# Patient Record
Sex: Male | Born: 1975 | Race: White | Hispanic: No | Marital: Married | Smoking: Current every day smoker
Health system: Southern US, Community
[De-identification: ages and names within clinical notes are randomized; demographics above are authoritative.]

## PROBLEM LIST (undated history)

## (undated) DIAGNOSIS — I10 Essential (primary) hypertension: Secondary | ICD-10-CM

## (undated) DIAGNOSIS — F1010 Alcohol abuse, uncomplicated: Secondary | ICD-10-CM

## (undated) DIAGNOSIS — S21339A Puncture wound without foreign body of unspecified front wall of thorax with penetration into thoracic cavity, initial encounter: Secondary | ICD-10-CM

## (undated) DIAGNOSIS — E785 Hyperlipidemia, unspecified: Secondary | ICD-10-CM

## (undated) HISTORY — PX: OTHER SURGICAL HISTORY: SHX169

## (undated) HISTORY — PX: FRONTAL SINUSOTOMY: SUR1296

## (undated) HISTORY — PX: ATRIAL SEPTECTOMY: SHX1199

---

## 2004-12-10 ENCOUNTER — Ambulatory Visit: Payer: Self-pay | Admitting: Family Medicine

## 2004-12-19 ENCOUNTER — Ambulatory Visit: Payer: Self-pay | Admitting: Family Medicine

## 2005-04-03 ENCOUNTER — Emergency Department: Payer: Self-pay | Admitting: Emergency Medicine

## 2005-10-30 ENCOUNTER — Emergency Department: Payer: Self-pay | Admitting: Emergency Medicine

## 2006-04-15 ENCOUNTER — Ambulatory Visit: Payer: Self-pay | Admitting: Surgery

## 2008-08-06 ENCOUNTER — Other Ambulatory Visit: Payer: Self-pay

## 2008-08-06 ENCOUNTER — Emergency Department: Payer: Self-pay | Admitting: Emergency Medicine

## 2008-10-03 ENCOUNTER — Emergency Department: Payer: Self-pay | Admitting: Unknown Physician Specialty

## 2011-10-03 ENCOUNTER — Ambulatory Visit: Payer: Self-pay

## 2015-05-23 ENCOUNTER — Encounter: Payer: Self-pay | Admitting: Emergency Medicine

## 2015-05-23 ENCOUNTER — Emergency Department
Admission: EM | Admit: 2015-05-23 | Discharge: 2015-05-24 | Disposition: A | Discharge status: Home / Self Care | Payer: No Typology Code available for payment source | Attending: Emergency Medicine | Admitting: Emergency Medicine

## 2015-05-23 DIAGNOSIS — F10230 Alcohol dependence with withdrawal, uncomplicated: Secondary | ICD-10-CM | POA: Insufficient documentation

## 2015-05-23 DIAGNOSIS — Z79899 Other long term (current) drug therapy: Secondary | ICD-10-CM | POA: Insufficient documentation

## 2015-05-23 DIAGNOSIS — F10930 Alcohol use, unspecified with withdrawal, uncomplicated: Secondary | ICD-10-CM

## 2015-05-23 DIAGNOSIS — Z720 Tobacco use: Secondary | ICD-10-CM | POA: Insufficient documentation

## 2015-05-23 DIAGNOSIS — I10 Essential (primary) hypertension: Secondary | ICD-10-CM | POA: Insufficient documentation

## 2015-05-23 DIAGNOSIS — Z008 Encounter for other general examination: Secondary | ICD-10-CM | POA: Diagnosis present

## 2015-05-23 HISTORY — DX: Alcohol abuse, uncomplicated: F10.10

## 2015-05-23 HISTORY — DX: Essential (primary) hypertension: I10

## 2015-05-23 LAB — COMPREHENSIVE METABOLIC PANEL
ALT: 50 U/L (ref 17–63)
AST: 59 U/L — ABNORMAL HIGH (ref 15–41)
Albumin: 4.4 g/dL (ref 3.5–5.0)
Alkaline Phosphatase: 90 U/L (ref 38–126)
Anion gap: 9 (ref 5–15)
BUN: 10 mg/dL (ref 6–20)
CO2: 28 mmol/L (ref 22–32)
CREATININE: 1.03 mg/dL (ref 0.61–1.24)
Calcium: 10 mg/dL (ref 8.9–10.3)
Chloride: 102 mmol/L (ref 101–111)
GFR calc Af Amer: >60 mL/min (ref >60)
GFR calc non Af Amer: >60 mL/min (ref >60)
GLUCOSE: 138 mg/dL — AB (ref 65–99)
POTASSIUM: 4.5 mmol/L (ref 3.5–5.1)
SODIUM: 139 mmol/L (ref 135–145)
Total Bilirubin: 0.5 mg/dL (ref 0.3–1.2)
Total Protein: 8.1 g/dL (ref 6.5–8.1)

## 2015-05-23 LAB — URINE DRUG SCREEN, QUALITATIVE (ARMC ONLY)
Amphetamines, Ur Screen: NOT DETECTED
BARBITURATES, UR SCREEN: NOT DETECTED
Benzodiazepine, Ur Scrn: NOT DETECTED
CANNABINOID 50 NG, UR ~~LOC~~: NOT DETECTED
COCAINE METABOLITE, UR ~~LOC~~: NOT DETECTED
MDMA (ECSTASY) UR SCREEN: NOT DETECTED
Methadone Scn, Ur: NOT DETECTED
Opiate, Ur Screen: NOT DETECTED
Phencyclidine (PCP) Ur S: NOT DETECTED
Tricyclic, Ur Screen: NOT DETECTED

## 2015-05-23 LAB — CBC
HEMATOCRIT: 48.2 % (ref 40.0–52.0)
HEMOGLOBIN: 16.6 g/dL (ref 13.0–18.0)
MCH: 33.2 pg (ref 26.0–34.0)
MCHC: 34.5 g/dL (ref 32.0–36.0)
MCV: 96.4 fL (ref 80.0–100.0)
PLATELETS: 299 10*3/uL (ref 150–440)
RBC: 5 MIL/uL (ref 4.40–5.90)
RDW: 13.1 % (ref 11.5–14.5)
WBC: 9.4 10*3/uL (ref 3.8–10.6)

## 2015-05-23 LAB — ETHANOL: Alcohol, Ethyl (B): <5 mg/dL (ref <5)

## 2015-05-23 MED ORDER — LORAZEPAM 2 MG/ML IJ SOLN
0.0000 mg | Freq: Four times a day (QID) | INTRAMUSCULAR | Status: DC
Start: 2015-05-23 — End: 2015-05-24

## 2015-05-23 MED ORDER — VITAMIN B-1 100 MG PO TABS
250.0000 mg | ORAL_TABLET | Freq: Once | ORAL | Status: AC
  Administered 2015-05-23: 250 mg via ORAL
  Filled 2015-05-23: qty 3

## 2015-05-23 MED ORDER — NICOTINE 10 MG IN INHA
1.0000 | RESPIRATORY_TRACT | Status: DC | PRN
  Administered 2015-05-23: 1 via RESPIRATORY_TRACT
  Administered 2015-05-23: 168 via RESPIRATORY_TRACT
  Administered 2015-05-24: 1 via RESPIRATORY_TRACT
  Filled 2015-05-23: qty 36

## 2015-05-23 MED ORDER — LORAZEPAM 2 MG PO TABS: 0.0000 mg | ORAL_TABLET | Freq: Two times a day (BID) | ORAL | Status: DC

## 2015-05-23 MED ORDER — LORAZEPAM 2 MG PO TABS
0.0000 mg | ORAL_TABLET | Freq: Four times a day (QID) | ORAL | Status: DC
  Administered 2015-05-23: 1 mg via ORAL
  Administered 2015-05-24: 2 mg via ORAL
  Filled 2015-05-23: qty 1

## 2015-05-23 MED ORDER — FOLIC ACID 1 MG PO TABS
1.0000 mg | ORAL_TABLET | Freq: Once | ORAL | Status: AC
  Administered 2015-05-23: 1 mg via ORAL
  Filled 2015-05-23: qty 1

## 2015-05-23 MED ORDER — LORAZEPAM 2 MG/ML IJ SOLN: 0.0000 mg | Freq: Two times a day (BID) | INTRAMUSCULAR | Status: DC

## 2015-05-23 MED ORDER — LORAZEPAM 1 MG PO TABS
ORAL_TABLET | ORAL | Status: AC
  Administered 2015-05-23: 1 mg via ORAL
  Filled 2015-05-23: qty 1

## 2015-05-23 MED ORDER — LORAZEPAM 1 MG PO TABS
1.0000 mg | ORAL_TABLET | ORAL | Status: AC
  Administered 2015-05-23: 1 mg via ORAL
  Filled 2015-05-23: qty 1

## 2015-05-23 MED ORDER — LISINOPRIL 10 MG PO TABS
10.0000 mg | ORAL_TABLET | Freq: Every day | ORAL | Status: DC
  Administered 2015-05-23 – 2015-05-24 (×2): 10 mg via ORAL
  Filled 2015-05-23 (×3): qty 1

## 2015-05-23 MED ORDER — VITAMIN B-1 100 MG PO TABS
100.0000 mg | ORAL_TABLET | Freq: Every day | ORAL | Status: DC
  Administered 2015-05-23 – 2015-05-24 (×2): 100 mg via ORAL
  Filled 2015-05-23 (×2): qty 1

## 2015-05-23 MED ORDER — THIAMINE HCL 100 MG/ML IJ SOLN: 100.0000 mg | Freq: Every day | INTRAMUSCULAR | Status: DC

## 2015-05-23 NOTE — ED Notes (Signed)
Pt. Noted sleeping in room. No complaints or concerns voiced. No distress or abnormal behavior noted. Will continue to monitor with security cameras. Q 15 minute rounds continue. 

## 2015-05-23 NOTE — ED Notes (Signed)
ED BHU [place] Is the patient under IVC or is there intent for IVC: No. Is the patient medically cleared: Yes.   Is there vacancy in the ED BHU: Yes.   Is the population mix appropriate for patient: Yes.   Is the patient awaiting placement in inpatient or outpatient setting: No. Has the patient had a psychiatric consult: No. Survey of unit performed for contraband, proper placement and condition of furniture, tampering with fixtures in bathroom, shower, and each patient room: Yes.  ; Findings:  APPEARANCE/BEHAVIOR calm, cooperative and adequate rapport can be established NEURO ASSESSMENT Orientation: time, place and person Hallucinations: No.None noted (Hallucinations) Speech: Normal Gait: normal RESPIRATORY ASSESSMENT Normal expansion.  Clear to auscultation.  No rales, rhonchi, or wheezing. CARDIOVASCULAR ASSESSMENT regular rate and rhythm, S1, S2 normal, no murmur, click, rub or gallop GASTROINTESTINAL ASSESSMENT soft, nontender, BS WNL, no r/g EXTREMITIES normal strength, tone, and muscle mass PLAN OF CARE Provide calm/safe environment. Vital signs assessed twice daily. ED BHU Assessment once each 12-hour shift. Collaborate with intake RN daily or as condition indicates. Assure the ED provider has rounded once each shift. Provide and encourage hygiene. Provide redirection as needed. Assess for escalating behavior; address immediately and inform ED provider.  Assess family dynamic and appropriateness for visitation as needed: Yes.  ; If necessary, describe findings:  Educate the patient/family about BHU procedures/visitation: Yes.  ; If necessary, describe findings:

## 2015-05-23 NOTE — ED Notes (Signed)
Resumed care from [facility].  Wife visiting pt at 1500.  Pt calm and cooperative.  Pt now watching tv.  Alert.  Skin warm and dry.

## 2015-05-23 NOTE — ED Notes (Signed)
Pt. Speaking tearfully to family on phone.
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
## 2015-05-23 NOTE — ED Notes (Signed)
Pt watching tv in his room.  States he is feeling better.

## 2015-05-23 NOTE — ED Notes (Signed)

## 2015-05-23 NOTE — ED Provider Notes (Signed)
-----------------------------------------   4:24 PM on 05/23/2015 -----------------------------------------  Patient has been accepted to [facility] for further treatment. They will pick him up at 5 PM today.  Harvest Dark, MD 05/23/15 [phone]
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
## 2015-05-23 NOTE — Discharge Instructions (Signed)
Alcohol Withdrawal Alcohol withdrawal happens when you normally drink alcohol a lot and suddenly stop drinking. Alcohol withdrawal symptoms can be mild to very bad. Mild withdrawal symptoms can include feeling sick to your stomach (nauseous), headache, or feeling irritable. Bad withdrawal symptoms can include shakiness, being very nervous (anxious), and not thinking clearly.  HOME CARE  Join an alcohol support group.  Stay away from people or situations that make you want to drink.  Eat a healthy diet. Eat a lot of fresh fruits, vegetables, and lean meats. GET HELP RIGHT AWAY IF:   You become confused. You start to see and hear things that are not really there.  You feel your heart beating very fast.  You throw up (vomit) blood or cannot stop throwing up. This may be bright red or look like black coffee grounds.  You have blood in your poop (stool). This may be bright red, maroon colored, or black and tarry.  You are lightheaded or pass out (faint).  You develop a fever. MAKE SURE YOU:   Understand these instructions.  Will watch your condition.  Will get help right away if you are not doing well or get worse. Document Released: 04/08/2008 Document Revised: 01/13/2012 Document Reviewed: 04/08/2008 [facility] Patient Information 2015 [place], [place]. This information is not intended to replace advice given to you by your health care provider. Make sure you discuss any questions you have with your health care provider.     Please proceed directly to [facility] for further treatment and evaluation. Return to the emergency department for any further emergent concerns.

## 2015-05-23 NOTE — ED Notes (Signed)
Patient assigned to appropriate care area. Patient oriented to unit/care area: Informed that, for their safety, care areas are designed for safety and monitored by staff at all times; and visiting hours explained to patient. Patient verbalizes understanding, and verbal contract for safety obtained. 

## 2015-05-23 NOTE — ED Notes (Signed)
ENVIRONMENTAL ASSESSMENT Potentially harmful objects out of patient reach: Yes.   Personal belongings secured: Yes.   Patient dressed in hospital provided attire only: Yes.   Plastic bags out of patient reach: Yes.   Patient care equipment (cords, cables, call bells, lines, and drains) shortened, removed, or accounted for: Yes.   Equipment and supplies removed from bottom of stretcher: Yes.   Potentially toxic materials out of patient reach: Yes.   Sharps container removed or out of patient reach: yes

## 2015-05-23 NOTE — ED Notes (Signed)
BEHAVIORAL HEALTH ROUNDING Patient sleeping: No. Patient alert and oriented: yes Behavior appropriate: Yes.  ; If no, describe:  Nutrition and fluids offered: Yes  Toileting and hygiene offered: Yes  Sitter present: yes Law enforcement present: Yes  

## 2015-05-23 NOTE — ED Notes (Signed)
Patient assigned to appropriate care area. Patient oriented to unit/care area: Informed that, for their safety, care areas are designed for safety and monitored by security cameras at all times; and visiting hours explained to patient. Patient verbalizes understanding, and verbal contract for safety obtained. 

## 2015-05-23 NOTE — ED Notes (Signed)
BEHAVIORAL HEALTH ROUNDING Patient sleeping: No. Patient alert and oriented: yes Behavior appropriate: Yes.  ; If no, describe:  Nutrition and fluids offered: Yes  Toileting and hygiene offered: Yes  Sitter present: no Law enforcement present: Yes  

## 2015-05-23 NOTE — ED Notes (Signed)
BEHAVIORAL HEALTH ROUNDING Patient sleeping: No. Patient alert and oriented: yes Behavior appropriate: Yes.  ; If no, describe:  Nutrition and fluids offered: Yes  Toileting and hygiene offered: Yes  Sitter present: not applicable Law enforcement present: Yes  

## 2015-05-23 NOTE — ED Notes (Signed)
Report received from [facility]. Pt. Alert and oriented in no distress denies SI, HI, AVH and pain.  Pt. Instructed to come to me with problems or concerns.Will continue to monitor for safety via security cameras and Q 15 minute checks.

## 2015-05-23 NOTE — ED Notes (Signed)
Pt here for alcohol detox. Pt states last drink was last night, around 930pm. Pt reports he drinks 10-20 shots of liquor and 12 beers a day. Pt states he's never been through detox before.

## 2015-05-23 NOTE — BH Specialist Note (Signed)
Client information has been faxed to [facility]; and client has been accepted; per Elmyra Ricks; and someone from [facility] will call back to the Er with transport time to [facility].

## 2015-05-23 NOTE — ED Notes (Signed)
ED BHU [place] Is the patient under IVC or is there intent for IVC: No. Is the patient medically cleared: Yes.   Is there vacancy in the ED BHU: Yes.   Is the population mix appropriate for patient: Yes.   Is the patient awaiting placement in inpatient or outpatient setting: Yes.   Has the patient had a psychiatric consult: No. Survey of unit performed for contraband, proper placement and condition of furniture, tampering with fixtures in bathroom, shower, and each patient room: Yes.  ; Findings:  APPEARANCE/BEHAVIOR calm, cooperative and adequate rapport can be established NEURO ASSESSMENT Orientation: time, place and person Hallucinations: No.None noted (Hallucinations) Speech: Normal Gait: normal RESPIRATORY ASSESSMENT Normal expansion.  Clear to auscultation.  No rales, rhonchi, or wheezing., No chest wall tenderness. CARDIOVASCULAR ASSESSMENT regular rate and rhythm, S1, S2 normal, no murmur, click, rub or gallop GASTROINTESTINAL ASSESSMENT soft, nontender, BS WNL, no r/g EXTREMITIES normal strength, tone, and muscle mass PLAN OF CARE Provide calm/safe environment. Vital signs assessed twice daily. ED BHU Assessment once each 12-hour shift. Collaborate with intake RN daily or as condition indicates. Assure the ED provider has rounded once each shift. Provide and encourage hygiene. Provide redirection as needed. Assess for escalating behavior; address immediately and inform ED provider.  Assess family dynamic and appropriateness for visitation as needed: Yes.  ; If necessary, describe findings:  Educate the patient/family about BHU procedures/visitation: Yes.  ; If necessary, describe findings:

## 2015-05-23 NOTE — ED Notes (Signed)
Pt. Noted in room. No complaints or concerns voiced. No distress or abnormal behavior noted. Will continue to monitor with security cameras. Q 15 minute rounds continue. Sandwich and soft drink given. 

## 2015-05-24 NOTE — ED Provider Notes (Signed)
-----------------------------------------   3:18 PM on 05/24/2015 -----------------------------------------   Blood pressure 133/90, pulse 63, temperature 97.9 F (36.6 C), temperature source Oral, resp. rate 18, height 5\' 9"  (1.753 m), weight 175 lb (79.379 kg), SpO2 100 %.  Patient clinically sober and cooperative at this time. Has been accepted to [facility]. We'll discharge. Is having transport arranged at this time.     Orbie Pyo, MD 05/24/15 [phone]

## 2015-05-24 NOTE — ED Notes (Signed)
BEHAVIORAL HEALTH ROUNDING Patient sleeping: No. Patient alert and oriented: yes Behavior appropriate: Yes.  ; If no, describe:  Nutrition and fluids offered: Yes Toileting and hygiene offered: Yes  Sitter present: yes Law enforcement present: Yes [facility]  

## 2015-05-24 NOTE — ED Notes (Signed)
Pt's daughter at bedside to visit.

## 2015-05-24 NOTE — ED Notes (Signed)
Pt. Noted sleeping in room. No complaints or concerns voiced. No distress or abnormal behavior noted. Will continue to monitor with security cameras. Q 15 minute rounds continue. 

## 2015-05-24 NOTE — Progress Notes (Signed)
Patient was accepted to [facility] by Virgilio Belling per Dr. Roda Shutters. Call report to [phone].  The receiving facility is ready for the patient to transfer at this time.  CSW will contact [facility] for transport.  CSW informed Megan, RN and Dr. Joni Fears of the updated disposition.     Chesley Noon, MSW, LCSW, [place] Clinical Social Worker [phone]

## 2015-05-24 NOTE — ED Notes (Signed)

## 2015-05-24 NOTE — [facility] Counselor (Signed)
Discussed with patient about being unable to transfer to [facility] due to them not taking private insurance.  Referral information faxed to [phone] [place]

## 2015-05-24 NOTE — ED Notes (Addendum)
BEHAVIORAL HEALTH ROUNDING Patient sleeping: No. Patient alert and oriented: yes Behavior appropriate: Yes.  ; If no, describe:  Nutrition and fluids offered: Yes Toileting and hygiene offered: Yes  Sitter present: yes Law enforcement present: Yes ODS  

## 2015-05-24 NOTE — Progress Notes (Signed)
[facility] was contact and will be here for discharge as soon as possible.  CSW informed Megan, RN and Dr. Clearnce Hasten of the pending transportation.   Chesley Noon, MSW, LCSW, [place] Clinical Social Worker [phone]

## 2015-05-24 NOTE — ED Provider Notes (Signed)
-----------------------------------------   6:40 AM on 05/24/2015 -----------------------------------------   Blood pressure 135/108, pulse 68, temperature 97.9 F (36.6 C), temperature source Oral, resp. rate 18, height 5\' 9"  (1.753 m), weight 175 lb (79.379 kg), SpO2 99 %.  The patient had no acute events since last update.  Calm and cooperative at this time.  Transfer to [facility] was delayed secondary to insurance issues. [facility] to reevaluate this morning.     Paulette Blanch, MD 05/24/15 [phone]

## 2015-05-24 NOTE — ED Notes (Signed)
NAD noted at time of D/C. Pt D/C to be taken to [facility] in [place] via [facility] [place] transportation, pt okay with this. Pt ambulatory to the lobby at time of D/C.

## 2017-01-13 ENCOUNTER — Other Ambulatory Visit: Payer: Self-pay | Admitting: Family Medicine

## 2017-01-13 ENCOUNTER — Ambulatory Visit
Admission: RE | Admit: 2017-01-13 | Discharge: 2017-01-13 | Disposition: A | Discharge status: Home / Self Care | Payer: No Typology Code available for payment source | Source: Ambulatory Visit | Attending: Family Medicine | Admitting: Family Medicine

## 2017-01-13 DIAGNOSIS — G319 Degenerative disease of nervous system, unspecified: Secondary | ICD-10-CM | POA: Insufficient documentation

## 2017-01-13 DIAGNOSIS — Z9889 Other specified postprocedural states: Secondary | ICD-10-CM | POA: Insufficient documentation

## 2017-01-13 DIAGNOSIS — R200 Anesthesia of skin: Secondary | ICD-10-CM

## 2017-01-13 DIAGNOSIS — J320 Chronic maxillary sinusitis: Secondary | ICD-10-CM | POA: Insufficient documentation

## 2017-03-17 IMAGING — CT CT HEAD W/O CM
3 series · 16 of 47 positions shown, 19 images · non-contrast
Comparison: 10/03/2008 head CT.

CLINICAL DATA: 40-year-old hypertensive male with history of
alcohol abuse presenting with left-sided numbness and tingling since
this morning. Dizziness when lying down. Initial encounter.

EXAM:
CT HEAD WITHOUT CONTRAST
TECHNIQUE: Contiguous axial images were obtained from the base of the skull
through the vertex without intravenous contrast.

[Series 2: head wo · axial · 0.41mm/px · z∈[-51,+94]mm · 10 of 35 slices shown, 13 images]
[im 3/35  brain]
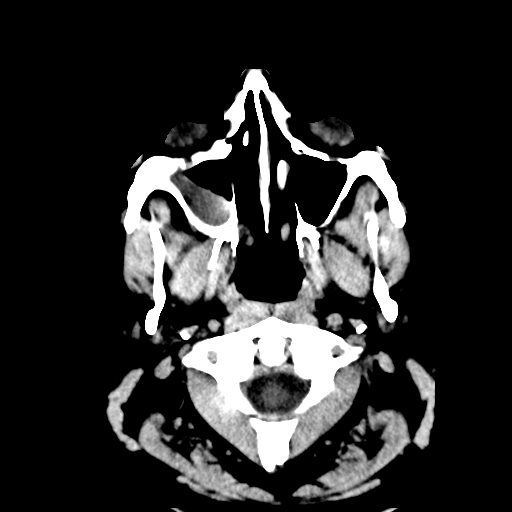
[im 3/35  bone]
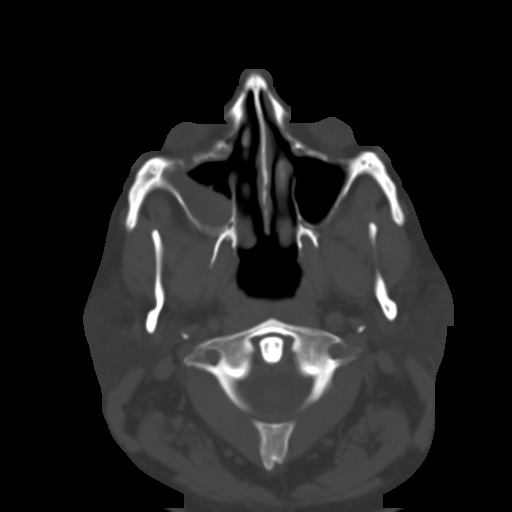
[im 6/35  brain]
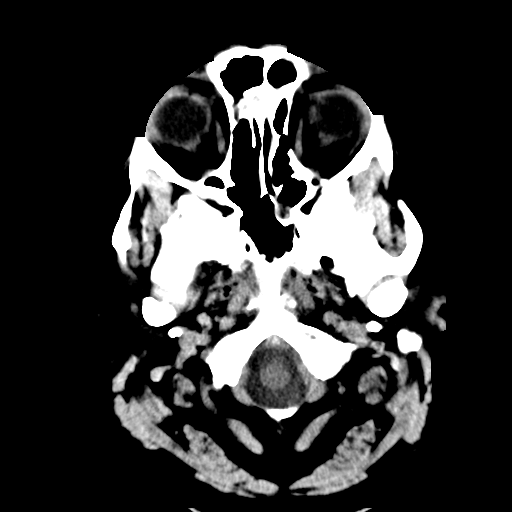
[im 10/35  brain]
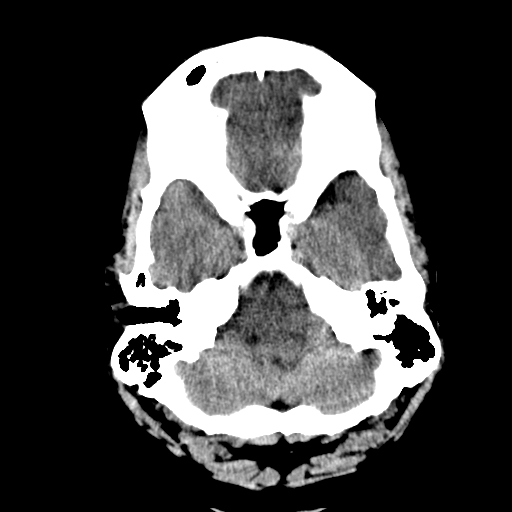
[im 12/35  brain]
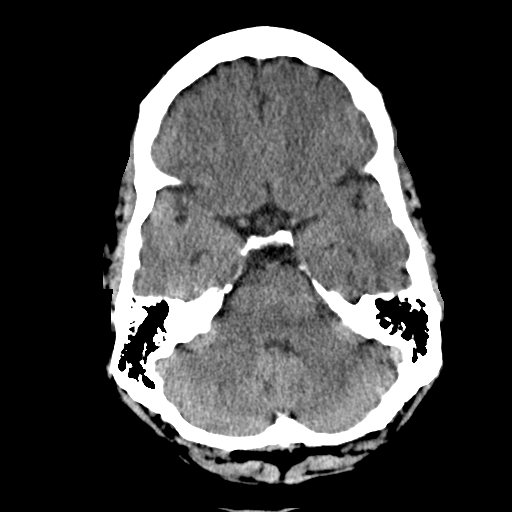
[im 16/35  brain]
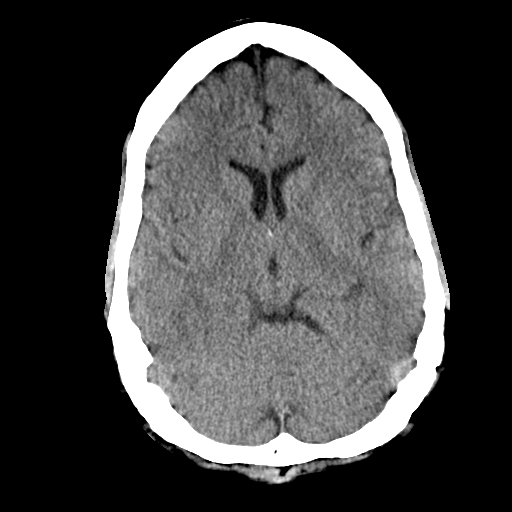
[im 16/35  bone]
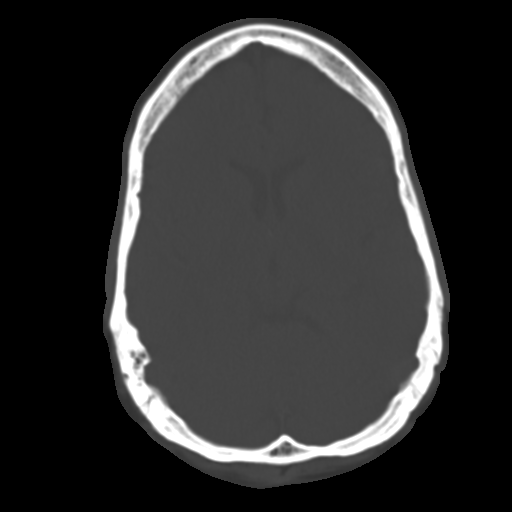
[im 19/35  brain]
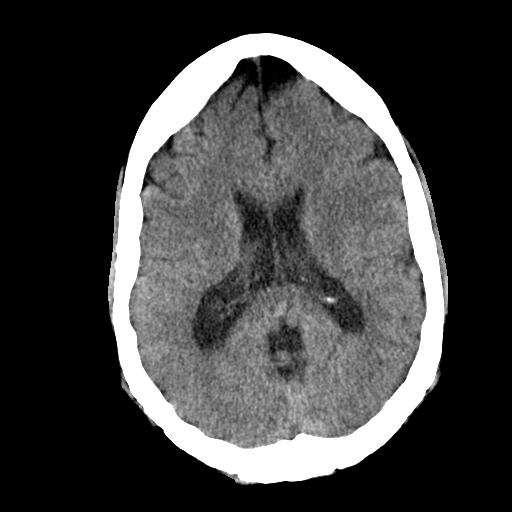
[im 23/35  brain]
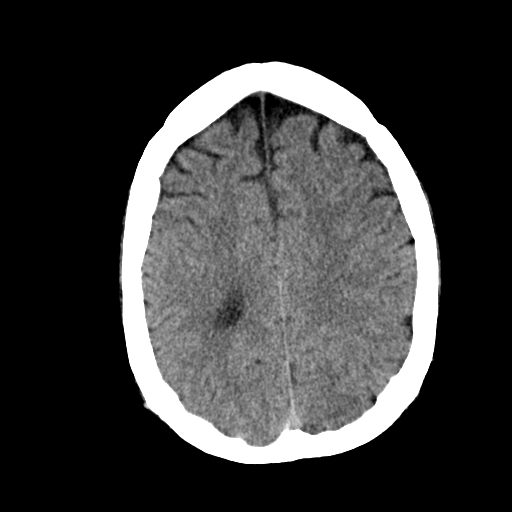
[im 26/35  brain]
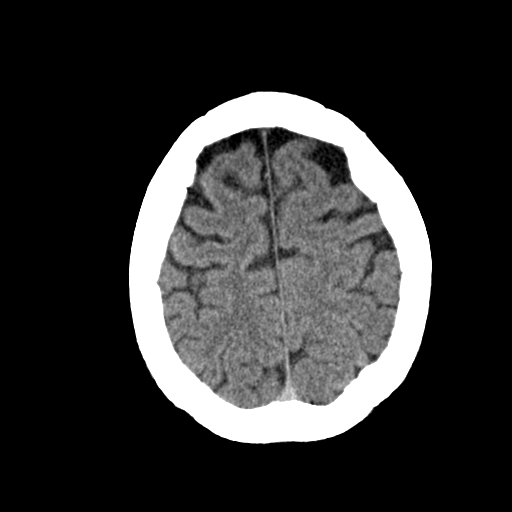
[im 29/35  brain]
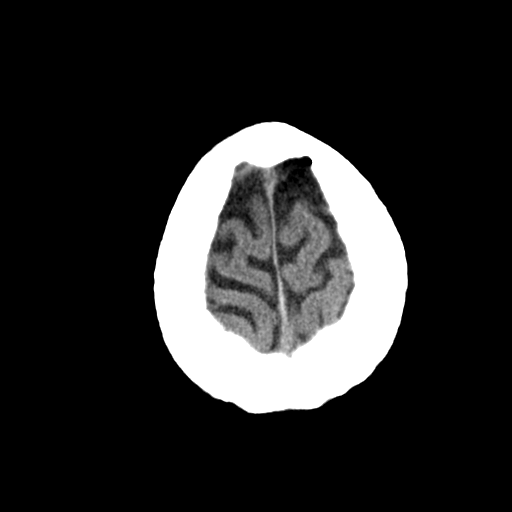
[im 29/35  bone]
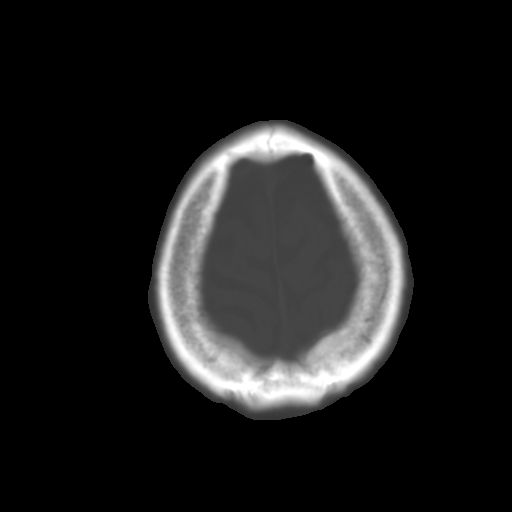
[im 32/35  brain]
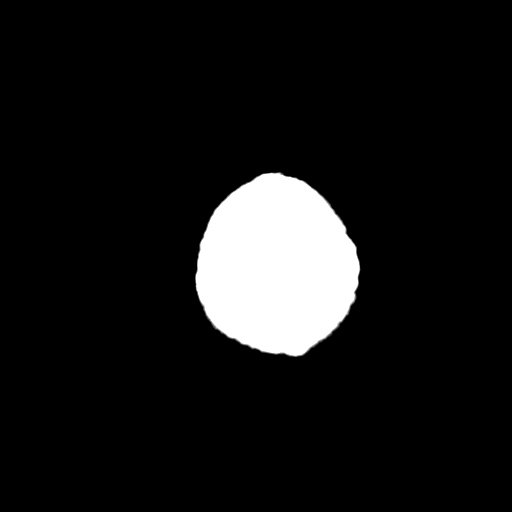

[Series 602: coronal · coronal · 0.41mm/px · 3 of 65 slices shown]
[im 22/65  brain]
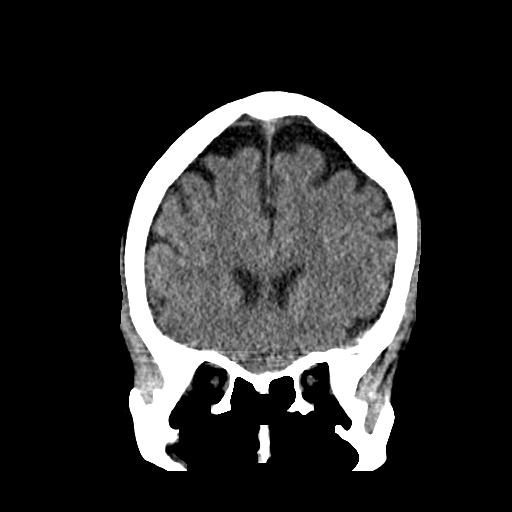
[im 29/65  brain]
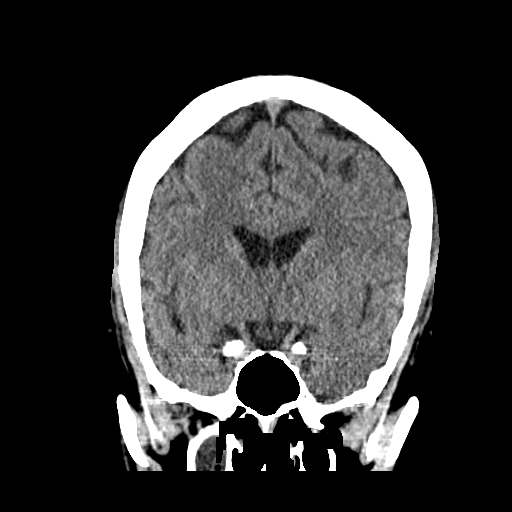
[im 36/65  brain]
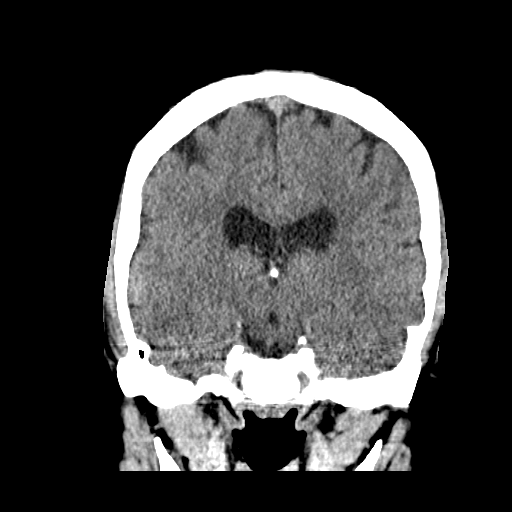

[Series 603: sagittal · sagittal · 0.41mm/px · 3 of 50 slices shown]
[im 17/50  brain]
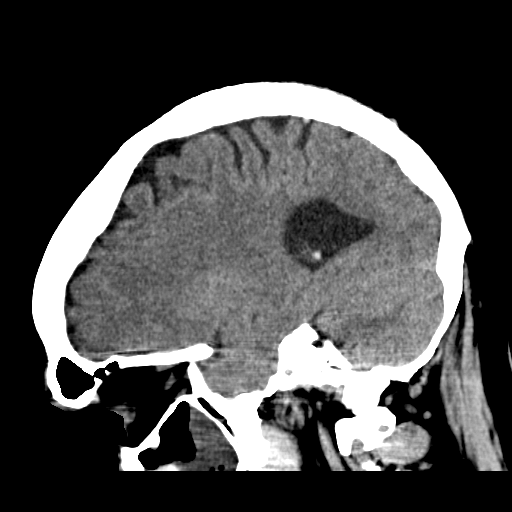
[im 25/50  brain]
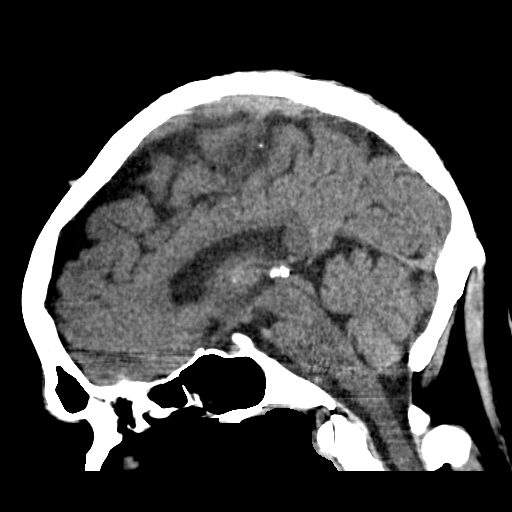
[im 33/50  brain]
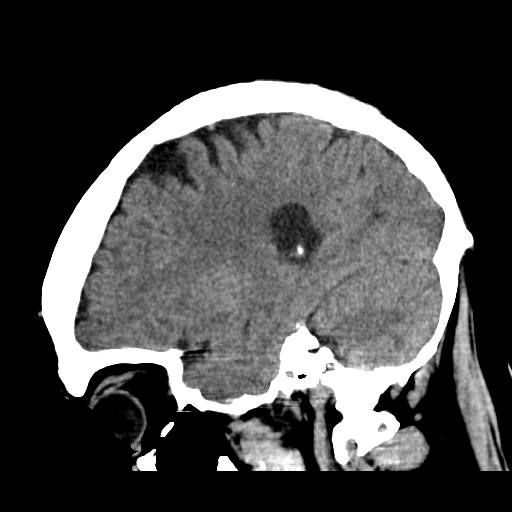

[16 of 47 positions shown; findings below may reference images not displayed]

FINDINGS: Brain: No intracranial hemorrhage or CT evidence of large acute
infarct.

No intracranial mass lesion noted on this unenhanced exam.

Mild frontal-parietal lobe atrophy without hydrocephalus.

Vascular: No hyperdense vessel.

Skull: No calvarial abnormality.

Sinuses/Orbits: No acute orbital abnormality.  Mild exophthalmos.

Prior right medial antrectomy. Prominent right maxillary sinus
mucosal thickening measuring up to 1.4 cm.

Other: Negative.
IMPRESSION: No intracranial hemorrhage or CT evidence of large acute infarct.

Mild frontal-parietal lobe atrophy without hydrocephalus.

Prior right medial antrectomy. Prominent right maxillary sinus
mucosal thickening measuring up to 1.4 cm.

## 2018-05-06 ENCOUNTER — Encounter: Payer: Self-pay | Admitting: Emergency Medicine

## 2018-05-06 ENCOUNTER — Other Ambulatory Visit: Payer: Self-pay

## 2018-05-06 ENCOUNTER — Emergency Department
Admission: EM | Admit: 2018-05-06 | Discharge: 2018-05-06 | Disposition: A | Discharge status: Home / Self Care | Payer: BLUE CROSS/BLUE SHIELD | Attending: Emergency Medicine | Admitting: Emergency Medicine

## 2018-05-06 DIAGNOSIS — Z79899 Other long term (current) drug therapy: Secondary | ICD-10-CM | POA: Diagnosis not present

## 2018-05-06 DIAGNOSIS — R21 Rash and other nonspecific skin eruption: Secondary | ICD-10-CM | POA: Diagnosis present

## 2018-05-06 DIAGNOSIS — F17210 Nicotine dependence, cigarettes, uncomplicated: Secondary | ICD-10-CM | POA: Diagnosis not present

## 2018-05-06 DIAGNOSIS — I10 Essential (primary) hypertension: Secondary | ICD-10-CM | POA: Diagnosis not present

## 2018-05-06 MED ORDER — PREDNISONE 10 MG PO TABS: ORAL_TABLET | ORAL | 0 refills | Status: DC
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
## 2018-05-06 NOTE — ED Notes (Signed)
See triage note  Presents with possible chigger bites   Noticed areas to both ankles  Positive itching  No resp distress noted

## 2018-05-06 NOTE — ED Triage Notes (Signed)
Pt with what appears to be chigger bites to ankles, right worse than left

## 2019-09-24 ENCOUNTER — Other Ambulatory Visit: Payer: Self-pay

## 2019-09-24 DIAGNOSIS — Z20822 Contact with and (suspected) exposure to covid-19: Secondary | ICD-10-CM

## 2019-09-27 LAB — NOVEL CORONAVIRUS, NAA: SARS-CoV-2, NAA: NOT DETECTED

## 2020-01-12 ENCOUNTER — Other Ambulatory Visit: Payer: Self-pay

## 2020-01-12 ENCOUNTER — Other Ambulatory Visit: Payer: Self-pay | Admitting: Family Medicine

## 2020-01-12 ENCOUNTER — Ambulatory Visit
Admission: RE | Admit: 2020-01-12 | Discharge: 2020-01-12 | Disposition: A | Discharge status: Home / Self Care | Payer: BC Managed Care – PPO | Attending: Family Medicine | Admitting: Family Medicine

## 2020-01-12 ENCOUNTER — Ambulatory Visit
Admission: RE | Admit: 2020-01-12 | Discharge: 2020-01-12 | Disposition: A | Discharge status: Home / Self Care | Payer: BC Managed Care – PPO | Source: Ambulatory Visit | Attending: Family Medicine | Admitting: Family Medicine

## 2020-01-12 DIAGNOSIS — R0600 Dyspnea, unspecified: Secondary | ICD-10-CM | POA: Insufficient documentation
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
## 2021-05-25 ENCOUNTER — Encounter: Payer: Self-pay | Admitting: *Deleted

## 2021-05-28 ENCOUNTER — Encounter
Admission: RE | Disposition: A | Discharge status: Home / Self Care | Payer: Self-pay | Source: Home / Self Care | Attending: Gastroenterology

## 2021-05-28 ENCOUNTER — Ambulatory Visit
Admission: RE | Admit: 2021-05-28 | Discharge: 2021-05-28 | Disposition: A | Discharge status: Home / Self Care | Payer: BC Managed Care – PPO | Attending: Gastroenterology | Admitting: Gastroenterology

## 2021-05-28 ENCOUNTER — Ambulatory Visit: Payer: BC Managed Care – PPO | Admitting: Anesthesiology

## 2021-05-28 ENCOUNTER — Other Ambulatory Visit: Payer: Self-pay

## 2021-05-28 ENCOUNTER — Encounter: Payer: Self-pay | Admitting: *Deleted

## 2021-05-28 DIAGNOSIS — Z79899 Other long term (current) drug therapy: Secondary | ICD-10-CM | POA: Insufficient documentation

## 2021-05-28 DIAGNOSIS — Z1211 Encounter for screening for malignant neoplasm of colon: Secondary | ICD-10-CM | POA: Insufficient documentation

## 2021-05-28 DIAGNOSIS — I10 Essential (primary) hypertension: Secondary | ICD-10-CM | POA: Diagnosis not present

## 2021-05-28 DIAGNOSIS — D123 Benign neoplasm of transverse colon: Secondary | ICD-10-CM | POA: Diagnosis not present

## 2021-05-28 DIAGNOSIS — K6389 Other specified diseases of intestine: Secondary | ICD-10-CM | POA: Diagnosis not present

## 2021-05-28 DIAGNOSIS — K640 First degree hemorrhoids: Secondary | ICD-10-CM | POA: Insufficient documentation

## 2021-05-28 DIAGNOSIS — Z7982 Long term (current) use of aspirin: Secondary | ICD-10-CM | POA: Diagnosis not present

## 2021-05-28 DIAGNOSIS — K5730 Diverticulosis of large intestine without perforation or abscess without bleeding: Secondary | ICD-10-CM | POA: Diagnosis not present

## 2021-05-28 DIAGNOSIS — E785 Hyperlipidemia, unspecified: Secondary | ICD-10-CM | POA: Insufficient documentation

## 2021-05-28 HISTORY — DX: Puncture wound without foreign body of unspecified front wall of thorax with penetration into thoracic cavity, initial encounter: S21.339A

## 2021-05-28 HISTORY — DX: Hyperlipidemia, unspecified: E78.5

## 2021-05-28 HISTORY — PX: COLONOSCOPY WITH PROPOFOL: SHX5780

## 2021-05-28 SURGERY — COLONOSCOPY WITH PROPOFOL
Anesthesia: General

## 2021-05-28 MED ORDER — FENTANYL CITRATE (PF) 100 MCG/2ML IJ SOLN
INTRAMUSCULAR | Status: DC | PRN
  Administered 2021-05-28: 25 ug via INTRAVENOUS
  Administered 2021-05-28: 50 ug via INTRAVENOUS
  Administered 2021-05-28: 25 ug via INTRAVENOUS

## 2021-05-28 MED ORDER — MIDAZOLAM HCL 2 MG/2ML IJ SOLN
INTRAMUSCULAR | Status: AC
  Filled 2021-05-28: qty 2

## 2021-05-28 MED ORDER — SODIUM CHLORIDE 0.9 % IV SOLN: INTRAVENOUS | Status: DC

## 2021-05-28 MED ORDER — FENTANYL CITRATE (PF) 100 MCG/2ML IJ SOLN
INTRAMUSCULAR | Status: AC
  Filled 2021-05-28: qty 2

## 2021-05-28 MED ORDER — EPHEDRINE 5 MG/ML INJ
INTRAVENOUS | Status: AC
  Filled 2021-05-28: qty 5

## 2021-05-28 MED ORDER — PHENYLEPHRINE HCL (PRESSORS) 10 MG/ML IV SOLN
INTRAVENOUS | Status: DC | PRN
  Administered 2021-05-28 (×2): 100 ug via INTRAVENOUS

## 2021-05-28 MED ORDER — LIDOCAINE HCL (CARDIAC) PF 100 MG/5ML IV SOSY
PREFILLED_SYRINGE | INTRAVENOUS | Status: DC | PRN
  Administered 2021-05-28: 50 mg via INTRAVENOUS

## 2021-05-28 MED ORDER — PROPOFOL 500 MG/50ML IV EMUL
INTRAVENOUS | Status: DC | PRN
  Administered 2021-05-28: 50 ug/kg/min via INTRAVENOUS

## 2021-05-28 MED ORDER — DEXMEDETOMIDINE (PRECEDEX) IN NS 20 MCG/5ML (4 MCG/ML) IV SYRINGE
PREFILLED_SYRINGE | INTRAVENOUS | Status: DC | PRN
  Administered 2021-05-28: 8 ug via INTRAVENOUS
  Administered 2021-05-28: 12 ug via INTRAVENOUS

## 2021-05-28 MED ORDER — DEXMEDETOMIDINE (PRECEDEX) IN NS 20 MCG/5ML (4 MCG/ML) IV SYRINGE
PREFILLED_SYRINGE | INTRAVENOUS | Status: AC
  Filled 2021-05-28: qty 10

## 2021-05-28 MED ORDER — PROPOFOL 10 MG/ML IV BOLUS
INTRAVENOUS | Status: DC | PRN
  Administered 2021-05-28: 50 mg via INTRAVENOUS
  Administered 2021-05-28: 10 mg via INTRAVENOUS
  Administered 2021-05-28: 200 mg via INTRAVENOUS

## 2021-05-28 MED ORDER — PROPOFOL 500 MG/50ML IV EMUL
INTRAVENOUS | Status: AC
  Filled 2021-05-28: qty 50

## 2021-05-28 MED ORDER — MIDAZOLAM HCL 2 MG/2ML IJ SOLN
INTRAMUSCULAR | Status: DC | PRN
  Administered 2021-05-28 (×2): 2 mg via INTRAVENOUS

## 2021-05-28 NOTE — Anesthesia Postprocedure Evaluation (Signed)
Anesthesia Post Note  Patient: Joe Weber  Procedure(s) Performed: COLONOSCOPY WITH PROPOFOL  Patient location during evaluation: Endoscopy Anesthesia Type: General Level of consciousness: awake and alert Pain management: pain level controlled Vital Signs Assessment: post-procedure vital signs reviewed and stable Respiratory status: spontaneous breathing, nonlabored ventilation, respiratory function stable and patient connected to nasal cannula oxygen Cardiovascular status: blood pressure returned to baseline and stable Postop Assessment: no apparent nausea or vomiting Anesthetic complications: no   No notable events documented.   Last Vitals:  Vitals:   05/28/21 1344 05/28/21 1354  BP: 104/68 (!) 87/57  Pulse: 71   Resp: 20   Temp: 36.8 C   SpO2:      Last Pain:  Vitals:   05/28/21 1414  TempSrc:   PainSc: 0-No pain                 Margaree Mackintosh

## 2021-05-28 NOTE — Interval H&P Note (Signed)
History and Physical Interval Note:  05/28/2021 12:17 PM  Joe Weber  has presented today for surgery, with the diagnosis of COLON CANCER SCREENING.  The various methods of treatment have been discussed with the patient and family. After consideration of risks, benefits and other options for treatment, the patient has consented to  Procedure(s) with comments: COLONOSCOPY WITH PROPOFOL (N/A) - WANTS AM APPOINTMENT as a surgical intervention.  The patient's history has been reviewed, patient examined, no change in status, stable for surgery.  I have reviewed the patient's chart and labs.  Questions were answered to the patient's satisfaction.     Joe Weber  Ok to proceed with colonoscopy

## 2021-05-28 NOTE — Transfer of Care (Signed)
Immediate Anesthesia Transfer of Care Note  Patient: Joe Weber  Procedure(s) Performed: COLONOSCOPY WITH PROPOFOL  Patient Location: PACU  Anesthesia Type:General  Level of Consciousness: sedated  Airway & Oxygen Therapy: Patient Spontanous Breathing and Patient connected to nasal cannula oxygen  Post-op Assessment: Report given to RN and Post -op Vital signs reviewed and stable  Post vital signs: Reviewed and stable  Last Vitals:  Vitals Value Taken Time  BP 104/68 05/28/21 1345  Temp    Pulse 71 05/28/21 1345  Resp 23 05/28/21 1345  SpO2 96 % 05/28/21 1345  Vitals shown include unvalidated device data.  Last Pain:  Vitals:   05/28/21 1150  TempSrc: Temporal  PainSc: 0-No pain         Complications: No notable events documented.

## 2021-05-28 NOTE — Op Note (Signed)
[facility] Gastroenterology Patient Name: Joe Weber Procedure Date: 05/28/2021 1:09 PM MRN: [id number] Account #: [id number] Date of Birth: 1975/12/25 Admit Type: Outpatient Age: 45 Room: [facility] ENDO ROOM 3 Gender: Male Note Status: Finalized Procedure:             Colonoscopy Indications:           Screening for colorectal malignant neoplasm Providers:             Andrey Farmer MD, MD Referring MD:          No Local Md, MD (Referring MD) Medicines:             Monitored Anesthesia Care Complications:         No immediate complications. Estimated blood loss:                         Minimal. Procedure:             Pre-Anesthesia Assessment:                        - Prior to the procedure, a History and Physical was                         performed, and patient medications and allergies were                         reviewed. The patient is competent. The risks and                         benefits of the procedure and the sedation options and                         risks were discussed with the patient. All questions                         were answered and informed consent was obtained.                         Patient identification and proposed procedure were                         verified by the physician, the nurse, the anesthetist                         and the technician in the endoscopy suite. Mental                         Status Examination: alert and oriented. Airway                         Examination: normal oropharyngeal airway and neck                         mobility. Respiratory Examination: clear to                         auscultation. CV Examination: normal. Prophylactic  Antibiotics: The patient does not require prophylactic                         antibiotics. Prior Anticoagulants: The patient has                         taken no previous anticoagulant or antiplatelet                         agents. ASA Grade  Assessment: II - A patient with mild                         systemic disease. After reviewing the risks and                         benefits, the patient was deemed in satisfactory                         condition to undergo the procedure. The anesthesia                         plan was to use monitored anesthesia care (MAC).                         Immediately prior to administration of medications,                         the patient was re-assessed for adequacy to receive                         sedatives. The heart rate, respiratory rate, oxygen                         saturations, blood pressure, adequacy of pulmonary                         ventilation, and response to care were monitored                         throughout the procedure. The physical status of the                         patient was re-assessed after the procedure.                        After obtaining informed consent, the colonoscope was                         passed under direct vision. Throughout the procedure,                         the patient's blood pressure, pulse, and oxygen                         saturations were monitored continuously. The                         Colonoscope was introduced through the anus and  advanced to the the terminal ileum. The colonoscopy                         was performed without difficulty. The patient                         tolerated the procedure well. The quality of the bowel                         preparation was good. Findings:      The perianal and digital rectal examinations were normal.      The terminal ileum appeared normal.      A 1 mm polyp was found in the cecum. The polyp was sessile. The polyp       was removed with a cold snare. Resection and retrieval were complete.       Estimated blood loss was minimal.      Two sessile polyps were found in the transverse colon. The polyps were 1       to 2 mm in size. These polyps were  removed with a jumbo cold forceps.       Resection and retrieval were complete. Estimated blood loss was minimal.      A 3 mm polyp was found in the transverse colon. The polyp was sessile.       The polyp was removed with a cold snare. Resection and retrieval were       complete. Estimated blood loss was minimal.      Multiple small and large-mouthed diverticula were found in the sigmoid       colon and descending colon.      A patchy area of mildly erythematous mucosa was found in the sigmoid       colon. Biopsies were taken with a cold forceps for histology. Estimated       blood loss was minimal.      A 3 mm polyp was found in the rectum. The polyp was sessile. The polyp       was removed with a cold snare. Resection and retrieval were complete.      Internal hemorrhoids were found during retroflexion. The hemorrhoids       were Grade I (internal hemorrhoids that do not prolapse).      The exam was otherwise without abnormality on direct and retroflexion       views. Impression:            - The examined portion of the ileum was normal.                        - One 1 mm polyp in the cecum, removed with a cold                         snare. Resected and retrieved.                        - Two 1 to 2 mm polyps in the transverse colon,                         removed with a jumbo cold forceps. Resected and  retrieved.                        - One 3 mm polyp in the transverse colon, removed with                         a cold snare. Resected and retrieved.                        - Diverticulosis in the sigmoid colon and in the                         descending colon.                        - Erythematous mucosa in the sigmoid colon. Biopsied.                        - Internal hemorrhoids.                        - The examination was otherwise normal on direct and                         retroflexion views. Recommendation:        - Discharge patient to home.                         - Resume previous diet.                        - Continue present medications.                        - Await pathology results.                        - Repeat colonoscopy for surveillance based on                         pathology results.                        - Return to referring physician as previously                         scheduled. Procedure Code(s):     --- Professional ---                        [id number], Colonoscopy, flexible; with removal of                         tumor(s), polyp(s), or other lesion(s) by snare                         technique                        45380, 59, Colonoscopy, flexible; with biopsy, single                         or multiple Diagnosis Code(s):     ---  Professional ---                        K63.5, Polyp of colon                        Z12.11, Encounter for screening for malignant neoplasm                         of colon                        K64.0, First degree hemorrhoids                        K63.89, Other specified diseases of intestine                        K57.30, Diverticulosis of large intestine without                         perforation or abscess without bleeding CPT copyright 2019 American Medical Association. All rights reserved. The codes documented in this report are preliminary and upon coder review may  be revised to meet current compliance requirements. Andrey Farmer MD, MD 05/28/2021 1:48:08 PM Number of Addenda: 0 Note Initiated On: 05/28/2021 1:09 PM Scope Withdrawal Time: 0 hours 14 minutes 0 seconds  Total Procedure Duration: 0 hours 18 minutes 15 seconds  Estimated Blood Loss:  Estimated blood loss was minimal. Estimated blood loss                         was minimal.      [facility]
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
## 2021-05-29 ENCOUNTER — Encounter: Payer: Self-pay | Admitting: Gastroenterology

## 2021-05-30 LAB — SURGICAL PATHOLOGY

## 2021-10-31 ENCOUNTER — Ambulatory Visit
Admission: EM | Admit: 2021-10-31 | Discharge: 2021-10-31 | Disposition: A | Discharge status: Home / Self Care | Payer: BC Managed Care – PPO

## 2021-10-31 ENCOUNTER — Other Ambulatory Visit: Payer: Self-pay

## 2021-10-31 DIAGNOSIS — R051 Acute cough: Secondary | ICD-10-CM

## 2021-10-31 DIAGNOSIS — J209 Acute bronchitis, unspecified: Secondary | ICD-10-CM | POA: Diagnosis not present

## 2021-10-31 MED ORDER — PREDNISONE 20 MG PO TABS: 40.0000 mg | ORAL_TABLET | Freq: Every day | ORAL | 0 refills | Status: AC

## 2021-10-31 MED ORDER — BENZONATATE 200 MG PO CAPS: 200.0000 mg | ORAL_CAPSULE | Freq: Three times a day (TID) | ORAL | 0 refills | Status: AC | PRN

## 2021-10-31 MED ORDER — AZITHROMYCIN 250 MG PO TABS: 250.0000 mg | ORAL_TABLET | Freq: Every day | ORAL | 0 refills | Status: AC

## 2021-10-31 NOTE — Discharge Instructions (Signed)
-  You have a chest cold.  As we discussed most the time it is due to a viral cause.  You should feel better over the next week.  We can try antibiotic at this time since its been 3 weeks and you are not feeling better.  I also sent a corticosteroid and cough medicine. - Increase rest and fluids. - Follow-up if any fever, chest pain or breathing difficulty.

## 2021-10-31 NOTE — ED Triage Notes (Signed)
Pt here with C/O cough for 3 weeks, gets worst through the day, gets dizzy at times when coughing.
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
## 2022-09-12 ENCOUNTER — Other Ambulatory Visit: Payer: Self-pay | Admitting: Family Medicine

## 2022-09-12 DIAGNOSIS — R1013 Epigastric pain: Secondary | ICD-10-CM

## 2022-09-17 ENCOUNTER — Other Ambulatory Visit: Payer: Self-pay | Admitting: Family Medicine

## 2022-09-17 ENCOUNTER — Ambulatory Visit: Payer: BC Managed Care – PPO

## 2022-09-17 DIAGNOSIS — R1013 Epigastric pain: Secondary | ICD-10-CM

## 2022-09-24 ENCOUNTER — Ambulatory Visit
Admission: RE | Admit: 2022-09-24 | Discharge: 2022-09-24 | Disposition: A | Discharge status: Home / Self Care | Payer: BC Managed Care – PPO | Source: Ambulatory Visit | Attending: Family Medicine | Admitting: Family Medicine

## 2022-09-24 DIAGNOSIS — R1013 Epigastric pain: Secondary | ICD-10-CM | POA: Diagnosis present

## 2022-12-16 ENCOUNTER — Other Ambulatory Visit: Payer: Self-pay | Admitting: Family Medicine

## 2022-12-16 ENCOUNTER — Ambulatory Visit
Admission: RE | Admit: 2022-12-16 | Discharge: 2022-12-16 | Disposition: A | Discharge status: Home / Self Care | Payer: BC Managed Care – PPO | Source: Ambulatory Visit | Attending: Family Medicine | Admitting: Family Medicine

## 2022-12-16 ENCOUNTER — Ambulatory Visit
Admission: RE | Admit: 2022-12-16 | Discharge: 2022-12-16 | Disposition: A | Discharge status: Home / Self Care | Payer: BC Managed Care – PPO | Attending: Family Medicine | Admitting: Family Medicine

## 2022-12-16 DIAGNOSIS — R0600 Dyspnea, unspecified: Secondary | ICD-10-CM
